# Patient Record
Sex: Male | Born: 1980 | Race: White | Hispanic: No | Marital: Married | Smoking: Never smoker
Health system: Southern US, Community
[De-identification: ages and names within clinical notes are randomized; demographics above are authoritative.]

---

## 2004-07-07 ENCOUNTER — Emergency Department: Payer: Self-pay | Admitting: Emergency Medicine

## 2007-08-17 ENCOUNTER — Emergency Department: Payer: Self-pay | Admitting: Emergency Medicine

## 2018-05-22 ENCOUNTER — Emergency Department
Admission: EM | Admit: 2018-05-22 | Discharge: 2018-05-22 | Disposition: A | Discharge status: Home / Self Care | Payer: 59 | Attending: Emergency Medicine | Admitting: Emergency Medicine

## 2018-05-22 ENCOUNTER — Other Ambulatory Visit: Payer: Self-pay

## 2018-05-22 ENCOUNTER — Encounter: Payer: Self-pay | Admitting: Emergency Medicine

## 2018-05-22 DIAGNOSIS — Y939 Activity, unspecified: Secondary | ICD-10-CM | POA: Diagnosis not present

## 2018-05-22 DIAGNOSIS — X58XXXA Exposure to other specified factors, initial encounter: Secondary | ICD-10-CM | POA: Insufficient documentation

## 2018-05-22 DIAGNOSIS — S51812A Laceration without foreign body of left forearm, initial encounter: Secondary | ICD-10-CM | POA: Diagnosis not present

## 2018-05-22 DIAGNOSIS — Z23 Encounter for immunization: Secondary | ICD-10-CM | POA: Diagnosis not present

## 2018-05-22 DIAGNOSIS — Y999 Unspecified external cause status: Secondary | ICD-10-CM | POA: Diagnosis not present

## 2018-05-22 DIAGNOSIS — Y929 Unspecified place or not applicable: Secondary | ICD-10-CM | POA: Insufficient documentation

## 2018-05-22 DIAGNOSIS — S59912A Unspecified injury of left forearm, initial encounter: Secondary | ICD-10-CM | POA: Diagnosis present

## 2018-05-22 MED ORDER — TETANUS-DIPHTH-ACELL PERTUSSIS 5-2.5-18.5 LF-MCG/0.5 IM SUSP
0.5000 mL | Freq: Once | INTRAMUSCULAR | Status: AC
  Administered 2018-05-22: 0.5 mL via INTRAMUSCULAR
  Filled 2018-05-22: qty 0.5

## 2018-05-22 NOTE — ED Provider Notes (Signed)
[facility] Emergency Department Provider Note  ____________________________________________  Time seen: Approximately 5:04 PM  I have reviewed the triage vital signs and the nursing notes.   HISTORY  Chief Complaint Laceration    HPI Tyrone Massey is a 38 y.o. male presents to the emergency department with a 1 cm left forearm laceration that appears superficial in nature.  Laceration is well approximated and scab formation has occurred.  Patient reports that he became "very scared" at home about tendon injury.  He has had some pain with movement of the thumb but has been able to move the thumb without difficulty.  No numbness or tingling along the left upper extremity. He cannot recall his last tetanus shot.         No past medical history on file.  There are no active problems to display for this patient.   History reviewed. No pertinent surgical history.  Prior to Admission medications   Not on File    Allergies Patient has no known allergies.  No family history on file.  Social History Social History   Tobacco Use  . Smoking status: Not on file  Substance Use Topics  . Alcohol use: Not on file  . Drug use: Not on file     Review of Systems  Constitutional: No fever/chills Eyes: No visual changes. No discharge ENT: No upper respiratory complaints. Cardiovascular: no chest pain. Respiratory: no cough. No SOB. Gastrointestinal: No abdominal pain.  No nausea, no vomiting.  No diarrhea.  No constipation. Genitourinary: Negative for dysuria. No hematuria Musculoskeletal: Negative for musculoskeletal pain. Skin: Patient has forearm laceration.  Neurological: Negative for headaches, focal weakness or numbness.  ____________________________________________   PHYSICAL EXAM:  VITAL SIGNS: ED Triage Vitals  Enc Vitals Group     BP 05/22/18 1605 109/74     Pulse Rate 05/22/18 1605 (!) 56     Resp 05/22/18 1605 18     Temp 05/22/18  1605 (!) 97.5 F (36.4 C)     Temp Source 05/22/18 1605 Oral     SpO2 05/22/18 1605 93 %     Weight 05/22/18 1608 205 lb (93 kg)     Height 05/22/18 1608 5\' 10"  (1.778 m)     Head Circumference --      Peak Flow --      Pain Score 05/22/18 1608 6     Pain Loc --      Pain Edu? --      Excl. in GC? --      Constitutional: Alert and oriented. Well appearing and in no acute distress. Eyes: Conjunctivae are normal. PERRL. EOMI. Head: Atraumatic. Cardiovascular: Normal rate, regular rhythm. Normal S1 and S2.  Good peripheral circulation. Respiratory: Normal respiratory effort without tachypnea or retractions. Lungs CTAB. Good air entry to the bases with no decreased or absent breath sounds. Gastrointestinal: Bowel sounds 4 quadrants. Soft and nontender to palpation. No guarding or rigidity. No palpable masses. No distention. No CVA tenderness. Musculoskeletal: Patient has 5 out of 5 strength in the upper extremities bilaterally and symmetrically.  He is able to perform resisted flexion, extension, abduction and adduction at the left thumb.  He is able to perform opposition and can perform flexion at the IP joint of the left thumb. Palpable radial pulse, left.  Neurologic:  Normal speech and language. No gross focal neurologic deficits are appreciated.  Skin:  Skin is warm, dry and intact. No rash noted. Psychiatric: Mood and affect are normal.  Speech and behavior are normal. Patient exhibits appropriate insight and judgement.   ____________________________________________   LABS (all labs ordered are listed, but only abnormal results are displayed)  Labs Reviewed - No data to display ____________________________________________  EKG   ____________________________________________  RADIOLOGY   No results found.  ____________________________________________    PROCEDURES  Procedure(s) performed:    Procedures  LACERATION REPAIR Performed by: Orvil Feil Authorized by: Orvil Feil Consent: Verbal consent obtained. Risks and benefits: risks, benefits and alternatives were discussed Consent given by: patient Patient identity confirmed: provided demographic data Prepped and Draped in normal sterile fashion Wound explored  Laceration Location: Left forearm   Laceration Length: 1 cm  No Foreign Bodies seen or palpated  Anesthesia: Topical   Local anesthetic: LET  Anesthetic total: 3 ml  Irrigation method: syringe Amount of cleaning: standard  Skin closure: Dermabond   Patient tolerance: Patient tolerated the procedure well with no immediate complications.   Medications  Tdap (BOOSTRIX) injection 0.5 mL (0.5 mLs Intramuscular Given 05/22/18 1630)     ____________________________________________   INITIAL IMPRESSION / ASSESSMENT AND PLAN / ED COURSE  Pertinent labs & imaging results that were available during my care of the patient were reviewed by me and considered in my medical decision making (see chart for details).  Review of the [place] CSRS was performed in accordance of the [facility] prior to dispensing any controlled drugs.           Assessment and plan Forearm laceration Patient presents to the emergency department with a 1 cm left forearm laceration repaired in the emergency department with Dermabond.  Patient was concerned and presented to the emergency department as he had some discomfort with movement of the left thumb.  Patient was able to exhibit full range of motion with no deficits in flexion, extension, abduction and adduction.  Patient was given a referral to Dr. Stephenie Acres if pain persists.  All patient questions were answered.    ____________________________________________  FINAL CLINICAL IMPRESSION(S) / ED DIAGNOSES  Final diagnoses:  Forearm laceration, left, initial encounter      NEW MEDICATIONS STARTED DURING THIS VISIT:  ED Discharge Orders    None          This chart was  dictated using voice recognition software/Dragon. Despite best efforts to proofread, errors can occur which can change the meaning. Any change was purely unintentional.    Orvil Feil, PA-C 05/22/18 1716    Sharman Cheek, MD 05/23/18 2238

## 2018-05-22 NOTE — ED Triage Notes (Signed)
Accidentally stuck self with pocket knife L forearm. Small lac with no bleeding at present.

## 2018-06-23 ENCOUNTER — Encounter: Payer: Self-pay | Admitting: Emergency Medicine

## 2018-06-23 ENCOUNTER — Emergency Department: Payer: 59

## 2018-06-23 ENCOUNTER — Other Ambulatory Visit: Payer: Self-pay

## 2018-06-23 ENCOUNTER — Emergency Department
Admission: EM | Admit: 2018-06-23 | Discharge: 2018-06-23 | Disposition: A | Discharge status: Home / Self Care | Payer: 59 | Attending: Emergency Medicine | Admitting: Emergency Medicine

## 2018-06-23 DIAGNOSIS — K5732 Diverticulitis of large intestine without perforation or abscess without bleeding: Secondary | ICD-10-CM | POA: Diagnosis not present

## 2018-06-23 DIAGNOSIS — R1032 Left lower quadrant pain: Secondary | ICD-10-CM | POA: Diagnosis present

## 2018-06-23 DIAGNOSIS — K5720 Diverticulitis of large intestine with perforation and abscess without bleeding: Secondary | ICD-10-CM

## 2018-06-23 LAB — COMPREHENSIVE METABOLIC PANEL
ALT: 20 U/L (ref 0–44)
AST: 16 U/L (ref 15–41)
Albumin: 4.1 g/dL (ref 3.5–5.0)
Alkaline Phosphatase: 75 U/L (ref 38–126)
Anion gap: 9 (ref 5–15)
BUN: 9 mg/dL (ref 6–20)
CO2: 23 mmol/L (ref 22–32)
Calcium: 9.1 mg/dL (ref 8.9–10.3)
Chloride: 102 mmol/L (ref 98–111)
Creatinine, Ser: 0.76 mg/dL (ref 0.61–1.24)
GFR calc Af Amer: >60 mL/min (ref >60)
GFR calc non Af Amer: >60 mL/min (ref >60)
Glucose, Bld: 93 mg/dL (ref 70–99)
Potassium: 3.8 mmol/L (ref 3.5–5.1)
Sodium: 134 mmol/L — ABNORMAL LOW (ref 135–145)
Total Bilirubin: 0.7 mg/dL (ref 0.3–1.2)
Total Protein: 7.6 g/dL (ref 6.5–8.1)

## 2018-06-23 LAB — CBC
HCT: 43.7 % (ref 39.0–52.0)
Hemoglobin: 14.6 g/dL (ref 13.0–17.0)
MCH: 30.7 pg (ref 26.0–34.0)
MCHC: 33.4 g/dL (ref 30.0–36.0)
MCV: 91.8 fL (ref 80.0–100.0)
Platelets: 209 10*3/uL (ref 150–400)
RBC: 4.76 MIL/uL (ref 4.22–5.81)
RDW: 12.1 % (ref 11.5–15.5)
WBC: 9.6 10*3/uL (ref 4.0–10.5)
nRBC: 0 % (ref 0.0–0.2)

## 2018-06-23 LAB — LIPASE, BLOOD: Lipase: 19 U/L (ref 11–51)

## 2018-06-23 MED ORDER — CIPROFLOXACIN HCL 500 MG PO TABS: 500.0000 mg | ORAL_TABLET | Freq: Two times a day (BID) | ORAL | 0 refills | Status: DC

## 2018-06-23 MED ORDER — SODIUM CHLORIDE 0.9% FLUSH: 3.0000 mL | Freq: Once | INTRAVENOUS | Status: DC

## 2018-06-23 MED ORDER — HYDROCODONE-ACETAMINOPHEN 5-325 MG PO TABS: 1.0000 | ORAL_TABLET | Freq: Four times a day (QID) | ORAL | 0 refills | Status: DC | PRN

## 2018-06-23 MED ORDER — METRONIDAZOLE 500 MG PO TABS: 500.0000 mg | ORAL_TABLET | Freq: Two times a day (BID) | ORAL | 0 refills | Status: DC

## 2018-06-23 MED ORDER — ONDANSETRON HCL 4 MG/2ML IJ SOLN
4.0000 mg | Freq: Once | INTRAMUSCULAR | Status: AC
  Administered 2018-06-23: 4 mg via INTRAVENOUS
  Filled 2018-06-23: qty 2

## 2018-06-23 MED ORDER — MORPHINE SULFATE (PF) 4 MG/ML IV SOLN
4.0000 mg | Freq: Once | INTRAVENOUS | Status: AC
  Administered 2018-06-23: 16:00:00 4 mg via INTRAVENOUS
  Filled 2018-06-23: qty 1

## 2018-06-23 NOTE — ED Notes (Signed)
Patient transported to CT. RN will give meds upon return

## 2018-06-23 NOTE — ED Triage Notes (Signed)
Pt presents to ED via POV with c/o LLQ abdominal pain since last night. Pt states "just a little bit of diarrhea but not much". Pt also endorses some urinary frequency since last night. Pt brought over from [facility] where he presented with 10/10 pain, given Toradol and pain now 5/10.

## 2018-06-23 NOTE — ED Provider Notes (Signed)
[facility] Emergency Department Provider Note   ____________________________________________    I have reviewed the triage vital signs and the nursing notes.   HISTORY  Chief Complaint Abdominal Pain     HPI Tyrone Massey is a 38 y.o. male who presents with complaints of left lower quadrant abdominal pain.  Patient reports abdominal pain started overnight and has gradually worsened.  He describes it as a moderate to severe cramping pain.  He is never had this before.  No history of abdominal surgery.  No fevers or chills.  Some loose stools.  Mild nausea no vomiting.  Seen in urgent care sent to the ED for further evaluation.  Urinalysis done that urgent care negative for hemoglobin  History reviewed. No pertinent past medical history.  There are no active problems to display for this patient.   History reviewed. No pertinent surgical history.  Prior to Admission medications   Medication Sig Start Date End Date Taking? Authorizing Provider  ciprofloxacin (CIPRO) 500 MG tablet Take 1 tablet (500 mg total) by mouth 2 (two) times daily. 06/23/18   Jene Every, MD  HYDROcodone-acetaminophen (NORCO/VICODIN) 5-325 MG tablet Take 1 tablet by mouth every 6 (six) hours as needed for severe pain. 06/23/18   Jene Every, MD  metroNIDAZOLE (FLAGYL) 500 MG tablet Take 1 tablet (500 mg total) by mouth 2 (two) times daily after a meal. 06/23/18   Jene Every, MD     Allergies Patient has no known allergies.  History reviewed. No pertinent family history.  Social History Social History   Tobacco Use  . Smoking status: Never Smoker  . Smokeless tobacco: Never Used  Substance Use Topics  . Alcohol use: Yes    Comment: Occ  . Drug use: Never    Review of Systems  Constitutional: No fever/chills Eyes: No visual changes.  ENT: No sore throat. Cardiovascular: Denies chest pain. Respiratory: Denies shortness of breath. Gastrointestinal: As above  Genitourinary: Negative for dysuria. Musculoskeletal: Negative for back pain. Skin: Negative for rash. Neurological: Negative for headaches    ____________________________________________   PHYSICAL EXAM:  VITAL SIGNS: ED Triage Vitals  Enc Vitals Group     BP 06/23/18 1536 134/79     Pulse --      Resp --      Temp --      Temp src --      SpO2 --      Weight 06/23/18 1155 93 kg (205 lb)     Height 06/23/18 1155 1.778 m (5\' 10" )     Head Circumference --      Peak Flow --      Pain Score 06/23/18 1155 5     Pain Loc --      Pain Edu? --      Excl. in GC? --     Constitutional: Alert and oriented.  Eyes: Conjunctivae are normal.   Nose: No congestion/rhinnorhea. Mouth/Throat: Mucous membranes are moist.    Cardiovascular: Normal rate, regular rhythm. Grossly normal heart sounds.  Good peripheral circulation. Respiratory: Normal respiratory effort.  No retractions. Lungs CTAB. Gastrointestinal: Tenderness palpation left lower quadrant, no distention, no CVA tenderness  Musculoskeletal: No lower extremity tenderness nor edema.  Warm and well perfused Neurologic:  Normal speech and language. No gross focal neurologic deficits are appreciated.  Skin:  Skin is warm, dry and intact. No rash noted. Psychiatric: Mood and affect are normal. Speech and behavior are normal.  ____________________________________________   LABS (  all labs ordered are listed, but only abnormal results are displayed)  Labs Reviewed  COMPREHENSIVE METABOLIC PANEL - Abnormal; Notable for the following components:      Result Value   Sodium 134 (*)    All other components within normal limits  LIPASE, BLOOD  CBC  URINALYSIS, COMPLETE (UACMP) WITH MICROSCOPIC   ____________________________________________  EKG  None ____________________________________________  RADIOLOGY  CT renal stone study demonstrates diverticulitis descending colon no perforation  ____________________________________________   PROCEDURES  Procedure(s) performed: No  Procedures   Critical Care performed: No ____________________________________________   INITIAL IMPRESSION / ASSESSMENT AND PLAN / ED COURSE  Pertinent labs & imaging results that were available during my care of the patient were reviewed by me and considered in my medical decision making (see chart for details).  Presents with left lower quadrant pain, he is tender in this area, suspicious for diverticulitis, ureterolithiasis or urinary tract infection is also certainly on the differential.  We will send for CT give IV morphine, IV Zofran and reevaluate.  Lab work is reassuring, outpatient urinalysis negative for hemoglobin or infection.  CT demonstrates diverticulitis, no perforation or abscess.   Patient's pain resolved after IV morphine, given reassuring lab work, no pain, reassuring CT will discharge on antibiotics pain medication, strict return precautions.    ____________________________________________   FINAL CLINICAL IMPRESSION(S) / ED DIAGNOSES  Final diagnoses:  Diverticulitis of large intestine with perforation without abscess or bleeding        Note:  This document was prepared using Dragon voice recognition software and may include unintentional dictation errors.   Jene EveryKinner, Ceasar Decandia, MD 06/23/18 [phone]

## 2020-04-21 IMAGING — CT CT RENAL STONE PROTOCOL
2 of 4 series · 16 of 46 positions shown, 18 images · non-contrast
Comparison: None.

CLINICAL DATA: Left lower quadrant pain for several hours

EXAM:
CT ABDOMEN AND PELVIS WITHOUT CONTRAST
TECHNIQUE: Multidetector CT imaging of the abdomen and pelvis was performed
following the standard protocol without IV contrast.

[Series 2: stone full standard · axial · 0.72mm/px · z∈[-483,-48]mm · 13 of 95 slices shown, 15 images]
[im 4/95  soft-tissue]
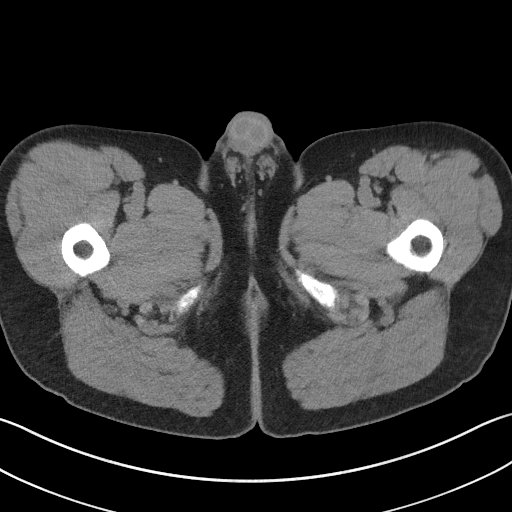
[im 4/95  bone]
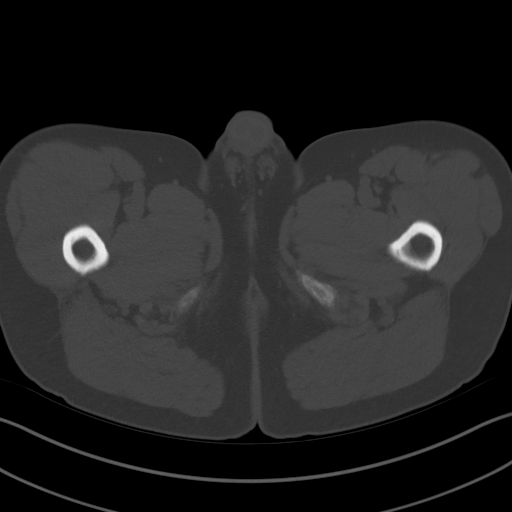
[im 12/95  soft-tissue]
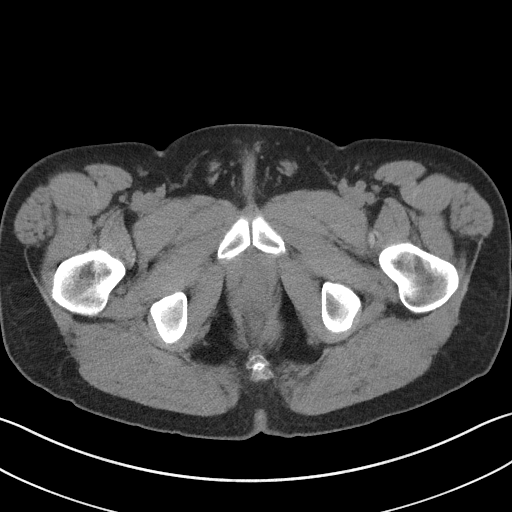
[im 20/95  soft-tissue]
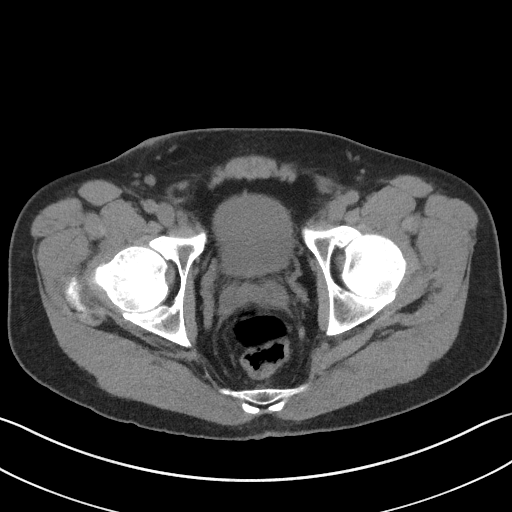
[im 28/95  soft-tissue]
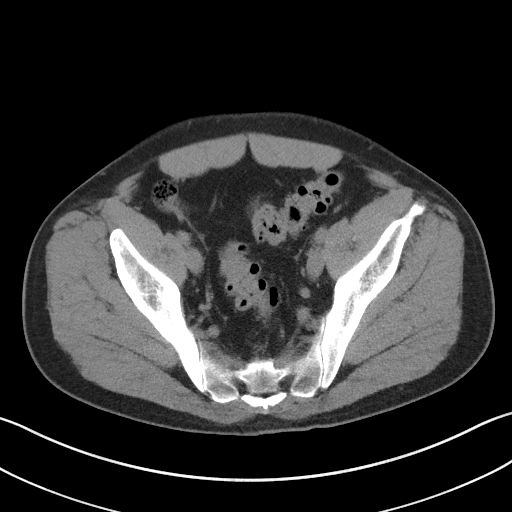
[im 32/95  soft-tissue]
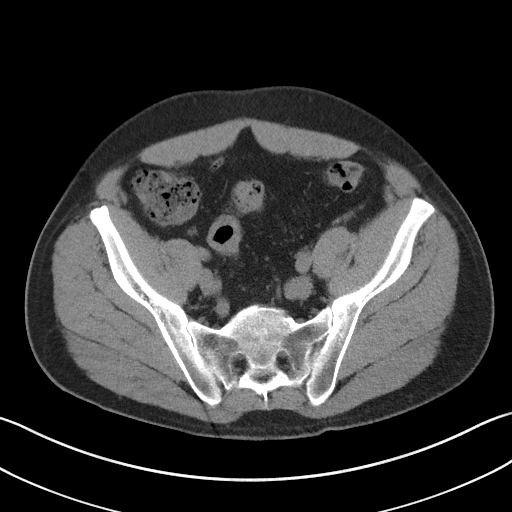
[im 40/95  soft-tissue]
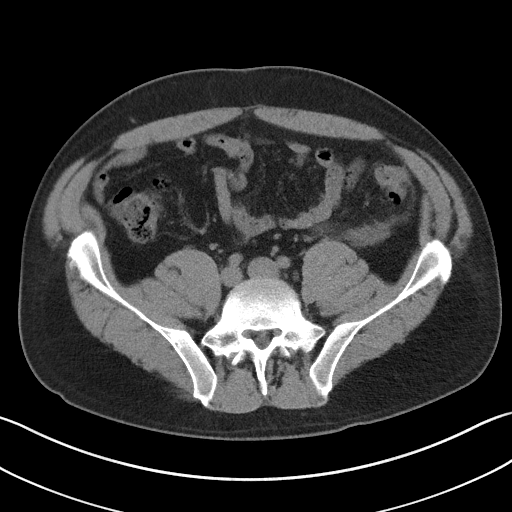
[im 48/95  soft-tissue]
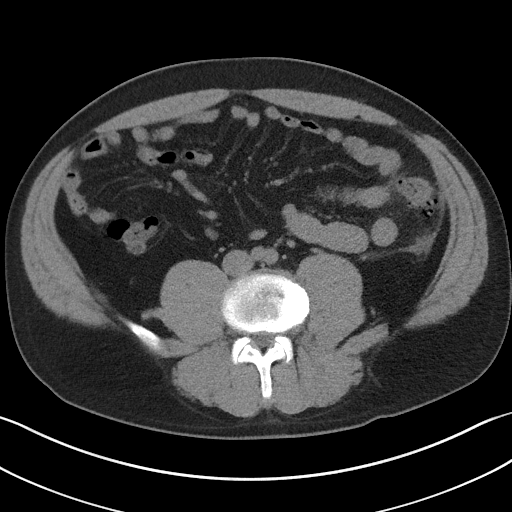
[im 55/95  soft-tissue]
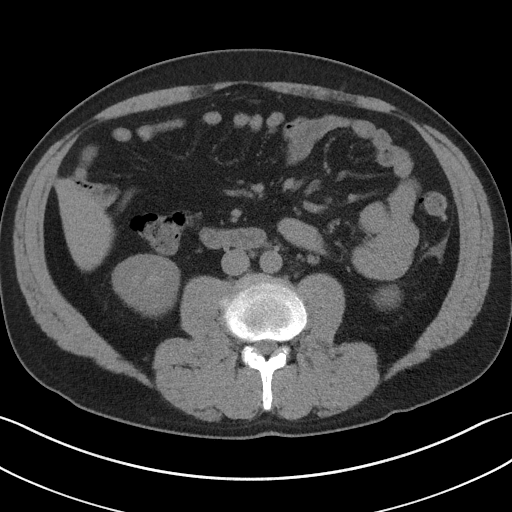
[im 63/95  soft-tissue]
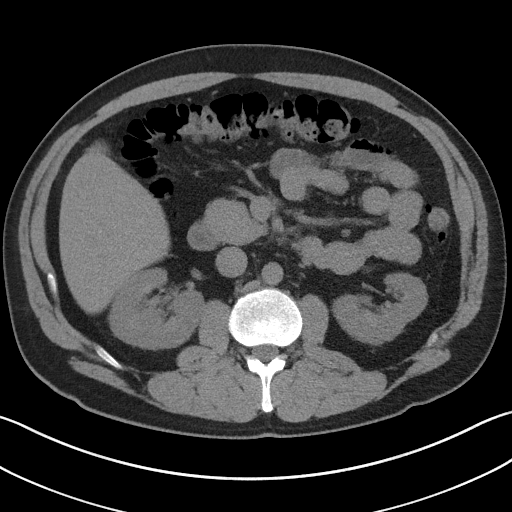
[im 63/95  bone]
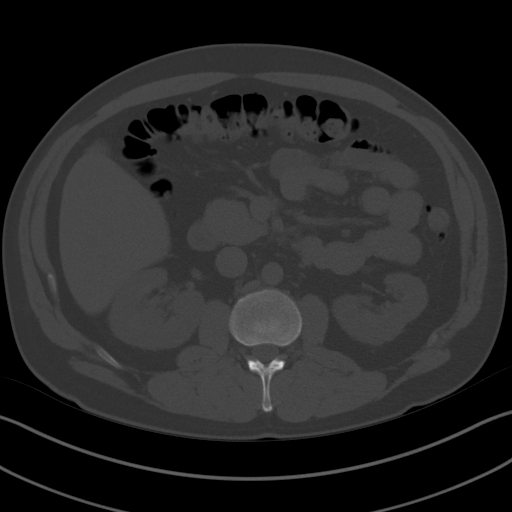
[im 67/95  soft-tissue]
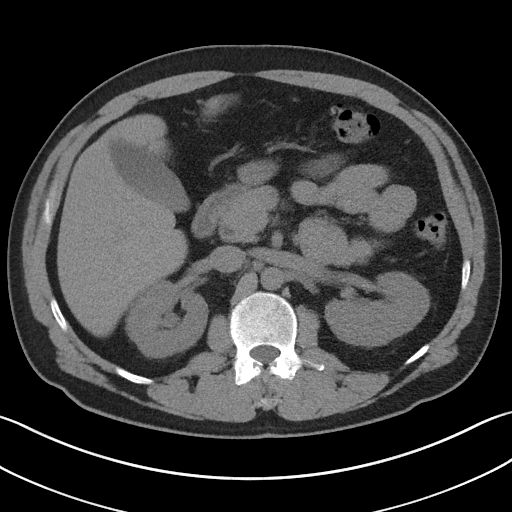
[im 75/95  soft-tissue]
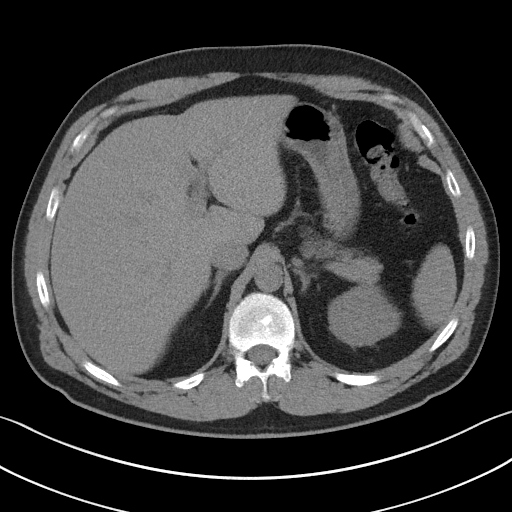
[im 83/95  soft-tissue]
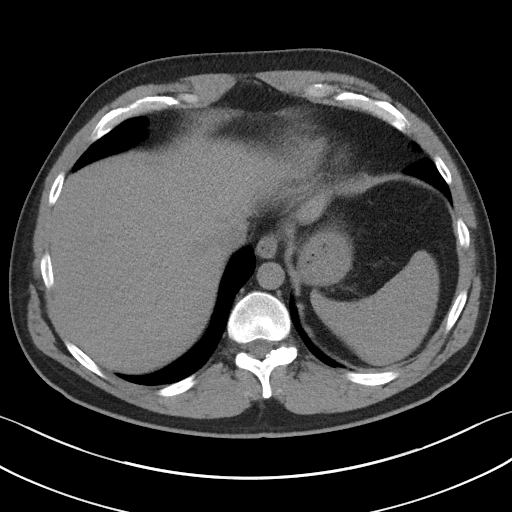
[im 91/95  soft-tissue]
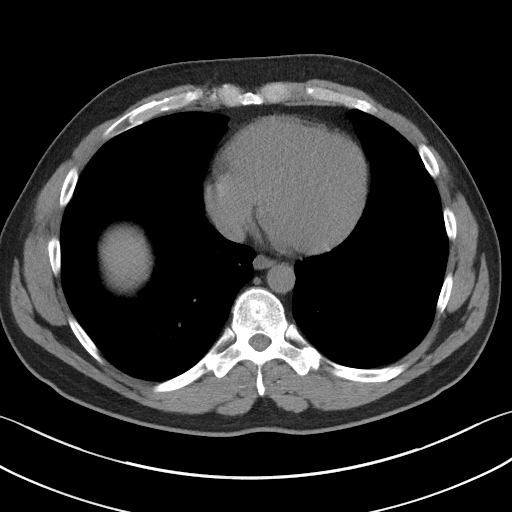

[Series 5: coronal · coronal · 0.79mm/px · 3 of 137 slices shown]
[im 46/137  soft-tissue]
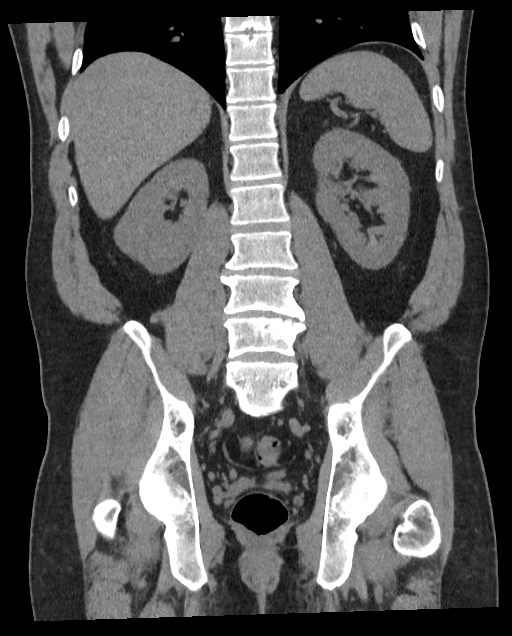
[im 61/137  soft-tissue]
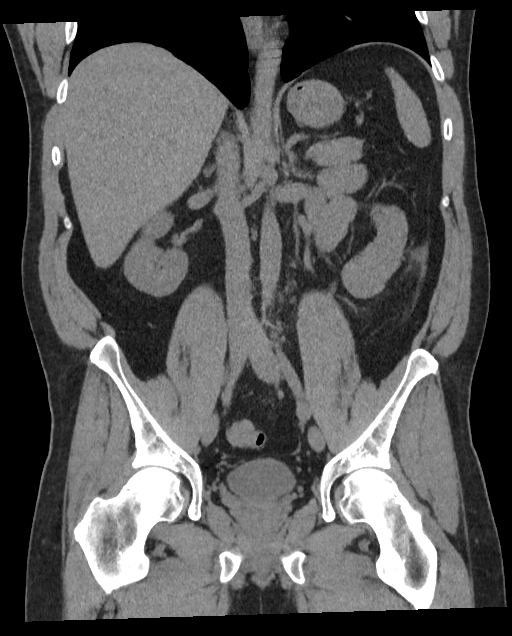
[im 76/137  soft-tissue]
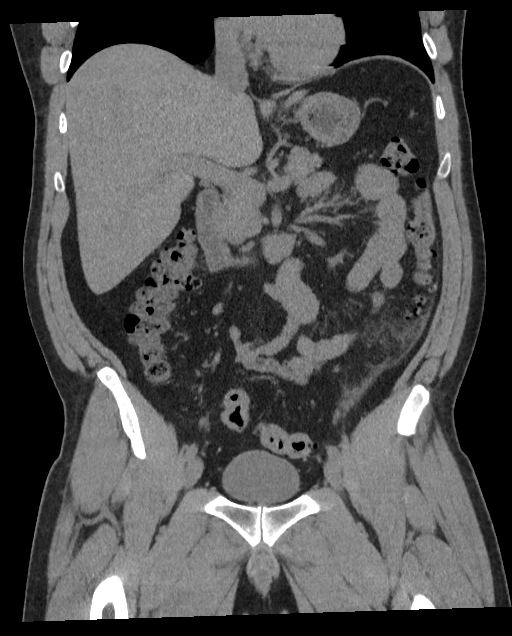

[16 of 46 positions shown; findings below may reference images not displayed]

FINDINGS: Lower chest: No acute abnormality.

Hepatobiliary: No focal liver abnormality is seen. No gallstones,
gallbladder wall thickening, or biliary dilatation.

Pancreas: Unremarkable. No pancreatic ductal dilatation or
surrounding inflammatory changes.

Spleen: Normal in size without focal abnormality.

Adrenals/Urinary Tract: Adrenal glands are within normal limits.
Kidneys demonstrate no renal calculi or urinary tract obstructive
changes. The bladder is decompressed.

Stomach/Bowel: Inflammatory changes are noted along the descending
colon without evidence of perforation or abscess formation
consistent with diverticulitis. Scattered diverticular changes noted
throughout the colon. The appendix is within normal limits. The
small bowel and stomach are unremarkable.

Vascular/Lymphatic: No significant vascular findings are present. No
enlarged abdominal or pelvic lymph nodes.

Reproductive: Prostate is unremarkable.

Other: No abdominal wall hernia or abnormality. No abdominopelvic
ascites.

Musculoskeletal: No acute or significant osseous findings.
IMPRESSION: Changes consistent with diverticulitis in the descending colon
without evidence of perforation or abscess formation.

## 2021-07-25 ENCOUNTER — Other Ambulatory Visit: Payer: Self-pay

## 2021-07-25 ENCOUNTER — Emergency Department
Admission: EM | Admit: 2021-07-25 | Discharge: 2021-07-25 | Disposition: A | Discharge status: Home / Self Care | Payer: 59 | Attending: Emergency Medicine | Admitting: Emergency Medicine

## 2021-07-25 DIAGNOSIS — S51812A Laceration without foreign body of left forearm, initial encounter: Secondary | ICD-10-CM

## 2021-07-25 DIAGNOSIS — Y990 Civilian activity done for income or pay: Secondary | ICD-10-CM | POA: Insufficient documentation

## 2021-07-25 DIAGNOSIS — X18XXXA Contact with other hot metals, initial encounter: Secondary | ICD-10-CM | POA: Insufficient documentation

## 2021-07-25 DIAGNOSIS — S59912A Unspecified injury of left forearm, initial encounter: Secondary | ICD-10-CM | POA: Diagnosis not present

## 2021-07-25 NOTE — ED Triage Notes (Signed)
Pt sustained one inch laceration to left inner forearm while installing duct work. Bleeding controlled.

## 2021-07-25 NOTE — ED Provider Notes (Signed)
[facility] Provider Note    Event Date/Time   First MD Initiated Contact with Patient 07/25/21 1247     (approximate)   History   Chief Complaint Laceration   HPI Tyrone Massey is a 41 y.o. male, no remarkable medical history, presents emergency department for evaluation of laceration to the left forearm.  He states he was installing some duct work when he accidentally cut his left forearm on a piece of metal.  Denies any foreign bodies.  No active bleeding at this time.  Denies fever/chills, numbness/tingling upper extremity, chest pain, shortness of breath, abdominal pain, or nausea/vomiting.  He is up-to-date on his tetanus.  History Limitations: No limitations.        Physical Exam  Triage Vital Signs: ED Triage Vitals  Enc Vitals Group     BP 07/25/21 1209 117/74     Pulse Rate 07/25/21 1209 (!) 55     Resp 07/25/21 1209 18     Temp 07/25/21 1212 97.9 F (36.6 C)     Temp Source 07/25/21 1209 Oral     SpO2 07/25/21 1209 97 %     Weight 07/25/21 1207 195 lb (88.5 kg)     Height 07/25/21 1207 5\' 10"  (1.778 m)     Head Circumference --      Peak Flow --      Pain Score 07/25/21 1207 0     Pain Loc --      Pain Edu? --      Excl. in GC? --     Most recent vital signs: Vitals:   07/25/21 1209 07/25/21 1212  BP: 117/74   Pulse: (!) 55   Resp: 18   Temp:  97.9 F (36.6 C)  SpO2: 97%     General: Awake, NAD.  Skin: Warm, dry. No rashes or lesions.  Eyes: PERRL. Conjunctivae normal.  CV: Good peripheral perfusion.  Resp: Normal effort.  Abd: Soft, non-tender. No distention.  Neuro: At baseline. No gross neurological deficits.   Focused Exam: 1 inch linear laceration along the left inner forearm, vertical.  No active bleeding or discharge.  No surrounding warmth or erythema.  No foreign bodies present.  Physical Exam    ED Results / Procedures / Treatments  Labs (all labs ordered are listed, but only abnormal results are  displayed) Labs Reviewed - No data to display   EKG N/A   RADIOLOGY  ED Provider Interpretation: N/A.  No results found.  PROCEDURES:  Critical Care performed: N/A.  09/24/21.Laceration Repair  Date/Time: 07/25/2021 1:26 PM  Performed by: 09/24/2021, PA Authorized by: Varney Daily, PA   Consent:    Consent obtained:  Verbal   Consent given by:  Patient   Risks discussed:  Infection and pain   Alternatives discussed:  No treatment Universal protocol:    Patient identity confirmed:  Verbally with patient Anesthesia:    Anesthesia method:  None Laceration details:    Location:  Shoulder/arm   Shoulder/arm location:  L lower arm   Length (cm):  3   Depth (mm):  2 Pre-procedure details:    Preparation:  Patient was prepped and draped in usual sterile fashion Exploration:    Hemostasis achieved with:  Direct pressure   Wound exploration: wound explored through full range of motion and entire depth of wound visualized     Wound extent: no fascia violation noted, no muscle damage noted, no underlying fracture noted and no vascular  damage noted   Treatment:    Area cleansed with:  Soap and water   Amount of cleaning:  Extensive   Irrigation solution:  Tap water   Irrigation volume:  5,000 ml   Irrigation method:  Tap Skin repair:    Repair method:  Tissue adhesive and Steri-Strips   Number of Steri-Strips:  2 Approximation:    Approximation:  Close Repair type:    Repair type:  Simple Post-procedure details:    Dressing:  Sterile dressing   Procedure completion:  Tolerated well, no immediate complications     MEDICATIONS ORDERED IN ED: Medications - No data to display   IMPRESSION / MDM / ASSESSMENT AND PLAN / ED COURSE  I reviewed the triage vital signs and the nursing notes.                              Differential diagnosis includes, but is not limited to, laceration, foreign body, cellulitis.  ED Course Patient appears well, vitals  within normal limits.  NAD.  Assessment/Plan Patient presents with left forearm laceration, 1 inch.  Cleansed thoroughly with soap and water.  No evidence of foreign bodies.  Repaired here with derma clips and Dermabond combination.  Patient tolerated the procedure well with no immediate complications.  We will plan to discharge.  Encouraged him to take Tylenol/ibuprofen as needed.  Patient's presentation is most consistent with acute, uncomplicated illness.   Provided the patient with anticipatory guidance, return precautions, and educational material. Encouraged the patient to return to the emergency department at any time if they begin to experience any new or worsening symptoms. Patient expressed understanding and agreed with the plan.       FINAL CLINICAL IMPRESSION(S) / ED DIAGNOSES   Final diagnoses:  Laceration of left forearm, initial encounter     Rx / DC Orders   ED Discharge Orders     None        Note:  This document was prepared using Dragon voice recognition software and may include unintentional dictation errors.   Varney Daily, [place] 07/25/21 1328    Concha Se, MD 07/25/21 [phone]

## 2021-07-25 NOTE — Discharge Instructions (Addendum)
-  Avoid washing the area for the first 48 to 72 hours.  After that you may take it in the shower.  Do not apply any topical ointments, lotions, or creams on the wound as this may cause the glue to dissolve.  -You may remove the derma clips in 10 days.  -Return to the emergency department anytime if you begin to experience any new or worsening symptoms.

## 2021-07-25 NOTE — ED Notes (Signed)
Wound care and reasons to return to ED discussed with pt, pt verbalized understanding. Pt ambulatory on D/C with steady gait.

## 2022-05-25 ENCOUNTER — Emergency Department: Payer: 59

## 2022-05-25 ENCOUNTER — Emergency Department
Admission: EM | Admit: 2022-05-25 | Discharge: 2022-05-25 | Disposition: A | Discharge status: Home / Self Care | Payer: 59 | Attending: Emergency Medicine | Admitting: Emergency Medicine

## 2022-05-25 DIAGNOSIS — R569 Unspecified convulsions: Secondary | ICD-10-CM | POA: Diagnosis not present

## 2022-05-25 DIAGNOSIS — Y990 Civilian activity done for income or pay: Secondary | ICD-10-CM | POA: Diagnosis not present

## 2022-05-25 DIAGNOSIS — X58XXXA Exposure to other specified factors, initial encounter: Secondary | ICD-10-CM | POA: Diagnosis not present

## 2022-05-25 DIAGNOSIS — I10 Essential (primary) hypertension: Secondary | ICD-10-CM | POA: Diagnosis not present

## 2022-05-25 DIAGNOSIS — S00512A Abrasion of oral cavity, initial encounter: Secondary | ICD-10-CM | POA: Insufficient documentation

## 2022-05-25 DIAGNOSIS — R9431 Abnormal electrocardiogram [ECG] [EKG]: Secondary | ICD-10-CM | POA: Diagnosis not present

## 2022-05-25 DIAGNOSIS — G40909 Epilepsy, unspecified, not intractable, without status epilepticus: Secondary | ICD-10-CM | POA: Diagnosis not present

## 2022-05-25 LAB — COMPREHENSIVE METABOLIC PANEL
ALT: 32 U/L (ref 0–44)
AST: 36 U/L (ref 15–41)
Albumin: 4 g/dL (ref 3.5–5.0)
Alkaline Phosphatase: 72 U/L (ref 38–126)
Anion gap: 10 (ref 5–15)
BUN: 13 mg/dL (ref 6–20)
CO2: 21 mmol/L — ABNORMAL LOW (ref 22–32)
Calcium: 9 mg/dL (ref 8.9–10.3)
Chloride: 106 mmol/L (ref 98–111)
Creatinine, Ser: 0.93 mg/dL (ref 0.61–1.24)
GFR, Estimated: >60 mL/min (ref >60)
Glucose, Bld: 103 mg/dL — ABNORMAL HIGH (ref 70–99)
Potassium: 3.8 mmol/L (ref 3.5–5.1)
Sodium: 137 mmol/L (ref 135–145)
Total Bilirubin: 0.5 mg/dL (ref 0.3–1.2)
Total Protein: 7.4 g/dL (ref 6.5–8.1)

## 2022-05-25 LAB — CBC WITH DIFFERENTIAL/PLATELET
Abs Immature Granulocytes: 0.06 10*3/uL (ref 0.00–0.07)
Basophils Absolute: 0 10*3/uL (ref 0.0–0.1)
Basophils Relative: 1 %
Eosinophils Absolute: 0.2 10*3/uL (ref 0.0–0.5)
Eosinophils Relative: 3 %
HCT: 44 % (ref 39.0–52.0)
Hemoglobin: 14.7 g/dL (ref 13.0–17.0)
Immature Granulocytes: 1 %
Lymphocytes Relative: 35 %
Lymphs Abs: 2.1 10*3/uL (ref 0.7–4.0)
MCH: 31.4 pg (ref 26.0–34.0)
MCHC: 33.4 g/dL (ref 30.0–36.0)
MCV: 94 fL (ref 80.0–100.0)
Monocytes Absolute: 0.5 10*3/uL (ref 0.1–1.0)
Monocytes Relative: 8 %
Neutro Abs: 3.2 10*3/uL (ref 1.7–7.7)
Neutrophils Relative %: 52 %
Platelets: 253 10*3/uL (ref 150–400)
RBC: 4.68 MIL/uL (ref 4.22–5.81)
RDW: 12.1 % (ref 11.5–15.5)
WBC: 6.1 10*3/uL (ref 4.0–10.5)
nRBC: 0 % (ref 0.0–0.2)

## 2022-05-25 LAB — CK: Total CK: 98 U/L (ref 49–397)

## 2022-05-25 LAB — MAGNESIUM: Magnesium: 2.3 mg/dL (ref 1.7–2.4)

## 2022-05-25 LAB — TROPONIN I (HIGH SENSITIVITY): Troponin I (High Sensitivity): 4 ng/L (ref <18)

## 2022-05-25 MED ORDER — KETOROLAC TROMETHAMINE 30 MG/ML IJ SOLN
15.0000 mg | Freq: Once | INTRAMUSCULAR | Status: AC
  Administered 2022-05-25: 15 mg via INTRAVENOUS
  Filled 2022-05-25: qty 1

## 2022-05-25 MED ORDER — DIAZEPAM 10 MG RE GEL: 10.0000 mg | Freq: Once | RECTAL | 0 refills | Status: AC

## 2022-05-25 MED ORDER — HYDROCODONE-ACETAMINOPHEN 5-325 MG PO TABS
2.0000 | ORAL_TABLET | Freq: Once | ORAL | Status: AC
  Administered 2022-05-25: 2 via ORAL
  Filled 2022-05-25: qty 2

## 2022-05-25 NOTE — Discharge Instructions (Signed)
You have been seen in the emergency department today for a likely seizure.  Your workup today including labs are within normal limits.  Please follow up with your doctor as soon as possible regarding today's emergency department visit and your likely seizure.  You will also need to follow up with a neurologist as soon as possible, please call for appointment.  If you have been prescribed a medication for your seizures, please take this medication as prescribed.  As we have discussed it is very important that you DO NOT drive until you have been seen and cleared by your neurologist.  Please drink plenty of fluids, get plenty of sleep and avoid any alcohol or drug use.  Return to the emergency department if you have any further seizures, develop any weakness/numbness of any arm/leg, confusion, slurred speech, or sudden/severe headache.  

## 2022-05-25 NOTE — ED Provider Notes (Signed)
[facility] Provider Note    Event Date/Time   First MD Initiated Contact with Patient 05/25/22 [phone]     (approximate)   History   Seizures   HPI  Tyrone Massey is a 42 y.o. male  here with first time seizure like episode. Pt was in his usual state of health last night. He denies any regular etoh use. He awoke this AM with EMS in his house. Per report, wife saw him begin having generalized seizure like activity lasting <1 min. He was then confused/post-ictal. He bit his tongue. Loss of bladder continence. He was confused with EMS but is now back to baseline. Denies any h/o seizures. No family h/o seizures. He has some occasional headaches but nothing regular. No focal numbness, weakness. He works in [employer]. Denies any recent trauma.       Physical Exam   Triage Vital Signs: ED Triage Vitals  Enc Vitals Group     BP 05/25/22 0808 (!) 145/96     Pulse Rate 05/25/22 0804 75     Resp 05/25/22 0804 16     Temp 05/25/22 0804 (!) 97.5 F (36.4 C)     Temp Source 05/25/22 0804 Oral     SpO2 05/25/22 0759 100 %     Weight 05/25/22 0807 194 lb 0.1 oz (88 kg)     Height 05/25/22 0807 5\' 10"  (1.778 m)     Head Circumference --      Peak Flow --      Pain Score 05/25/22 0807 5     Pain Loc --      Pain Edu? --      Excl. in GC? --     Most recent vital signs: Vitals:   05/25/22 0808 05/25/22 1045  BP: (!) 145/96 131/79  Pulse:  (!) 59  Resp:  (!) 6  Temp:    SpO2:  100%     General: Awake, no distress.  CV:  Good peripheral perfusion. RRR. Resp:  Normal work of breathing. Lungs clear. Abd:  No distention.  Other:  Superficial abrasion to left lateral tongue with no active bleeding. CNII-XII intact. Face symmetric. Speech normal. EOMI. Strength 5/5 bilateral UE and LE. Normal sensation to light touch. Gait is normal. No ataxia.   ED Results / Procedures / Treatments   Labs (all labs ordered are listed, but only abnormal results are  displayed) Labs Reviewed  COMPREHENSIVE METABOLIC PANEL - Abnormal; Notable for the following components:      Result Value   CO2 21 (*)    Glucose, Bld 103 (*)    All other components within normal limits  CBC WITH DIFFERENTIAL/PLATELET  MAGNESIUM  CK  URINE DRUG SCREEN, QUALITATIVE ([facility] ONLY)  ETHANOL  TROPONIN I (HIGH SENSITIVITY)     EKG Sinus rhythm, VR 73. PR 141, QRS 116, QTc 451. No acute St elevations or depressions. No ischemia or infarct.   RADIOLOGY CT Head: Negative Noncon CT   I also independently reviewed and agree with radiologist interpretations.   PROCEDURES:  Critical Care performed: No  .1-3 Lead EKG Interpretation  Performed by: Shaune Pollack, MD Authorized by: Shaune Pollack, MD     Interpretation: normal     ECG rate:  70-90   ECG rate assessment: normal     Rhythm: sinus rhythm     Ectopy: none     Conduction: normal   Comments:     Indication: Seizure/syncope   MEDICATIONS ORDERED IN  ED: Medications  HYDROcodone-acetaminophen (NORCO/VICODIN) 5-325 MG per tablet 2 tablet (2 tablets Oral Given 05/25/22 1028)  ketorolac (TORADOL) 30 MG/ML injection 15 mg (15 mg Intravenous Given 05/25/22 1029)     IMPRESSION / MDM / ASSESSMENT AND PLAN / ED COURSE  I reviewed the triage vital signs and the nursing notes.                              Differential diagnosis includes, but is not limited to, new onset seizure, convulsive syncope, arrhythmia, clonus, PNES  Patient's presentation is most consistent with acute presentation with potential threat to life or bodily function.  The patient is on the cardiac monitor to evaluate for evidence of arrhythmia and/or significant heart rate changes  Very well-appearing 42 year old male here with first-time, new onset seizure.  Patient did hit his head yesterday at work, query possible concussion related seizure.  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  No focal neurological deficits.   He is afebrile.  No evidence of meningitis or encephalitis or infectious process.  His CK is normal.  CBC, CMP is unremarkable.  He appears mildly dehydrated.  He is tolerating p.o. fluids.  No leukocytosis.  No anemia.  EKG nonischemic.  He has no ectopy or arrhythmia on telemetry evidence suggest cardiogenic or convulsive syncope.  I had a long discussion with the patient and his wife regarding new, first time seizure and need for outpatient neurology follow-up.  We also discussed the [place] rules for driving and need for clearance prior to returning.  Return precautions given.  Will give rectal Diastat as needed.    FINAL CLINICAL IMPRESSION(S) / ED DIAGNOSES   Final diagnoses:  Seizure     Rx / DC Orders   ED Discharge Orders          Ordered    diazepam (DIASTAT ACUDIAL) 10 MG GEL   Once        05/25/22 1108             Note:  This document was prepared using Dragon voice recognition software and may include unintentional dictation errors.   Shaune Pollack, MD 05/25/22 [id number]

## 2022-05-25 NOTE — ED Triage Notes (Signed)
Pt from home via ems- wife called 911 for seizure like activity that lasted about 15 seconds. Pt was incontinent of urine and postictal when ems arrived. Pt arrived to ED a/o x 4. C/o back pain.

## 2022-05-27 DIAGNOSIS — R5382 Chronic fatigue, unspecified: Secondary | ICD-10-CM | POA: Diagnosis not present

## 2022-05-27 DIAGNOSIS — R519 Headache, unspecified: Secondary | ICD-10-CM | POA: Diagnosis not present

## 2022-05-27 DIAGNOSIS — R400 Somnolence: Secondary | ICD-10-CM | POA: Diagnosis not present

## 2022-05-27 DIAGNOSIS — R569 Unspecified convulsions: Secondary | ICD-10-CM | POA: Diagnosis not present

## 2022-05-27 DIAGNOSIS — G479 Sleep disorder, unspecified: Secondary | ICD-10-CM | POA: Diagnosis not present

## 2022-05-27 DIAGNOSIS — R4189 Other symptoms and signs involving cognitive functions and awareness: Secondary | ICD-10-CM | POA: Diagnosis not present

## 2022-05-28 ENCOUNTER — Other Ambulatory Visit: Payer: Self-pay | Admitting: Neurology

## 2022-05-28 DIAGNOSIS — R569 Unspecified convulsions: Secondary | ICD-10-CM

## 2022-05-30 ENCOUNTER — Encounter: Payer: Self-pay | Admitting: Neurology

## 2022-06-02 ENCOUNTER — Ambulatory Visit
Admission: RE | Admit: 2022-06-02 | Discharge: 2022-06-02 | Disposition: A | Discharge status: Home / Self Care | Payer: 59 | Source: Ambulatory Visit | Attending: Neurology | Admitting: Neurology

## 2022-06-02 DIAGNOSIS — R569 Unspecified convulsions: Secondary | ICD-10-CM

## 2022-08-02 DIAGNOSIS — G4733 Obstructive sleep apnea (adult) (pediatric): Secondary | ICD-10-CM | POA: Diagnosis not present

## 2022-10-07 DIAGNOSIS — R519 Headache, unspecified: Secondary | ICD-10-CM | POA: Diagnosis not present

## 2022-10-07 DIAGNOSIS — G4719 Other hypersomnia: Secondary | ICD-10-CM | POA: Diagnosis not present

## 2022-10-07 DIAGNOSIS — R0681 Apnea, not elsewhere classified: Secondary | ICD-10-CM | POA: Diagnosis not present

## 2022-10-07 DIAGNOSIS — R569 Unspecified convulsions: Secondary | ICD-10-CM | POA: Diagnosis not present

## 2022-10-07 DIAGNOSIS — G4733 Obstructive sleep apnea (adult) (pediatric): Secondary | ICD-10-CM | POA: Diagnosis not present

## 2022-10-07 DIAGNOSIS — G479 Sleep disorder, unspecified: Secondary | ICD-10-CM | POA: Diagnosis not present

## 2022-10-14 DIAGNOSIS — G4733 Obstructive sleep apnea (adult) (pediatric): Secondary | ICD-10-CM | POA: Diagnosis not present

## 2022-11-14 DIAGNOSIS — G4733 Obstructive sleep apnea (adult) (pediatric): Secondary | ICD-10-CM | POA: Diagnosis not present

## 2022-12-08 DIAGNOSIS — R400 Somnolence: Secondary | ICD-10-CM | POA: Diagnosis not present

## 2022-12-08 DIAGNOSIS — G4733 Obstructive sleep apnea (adult) (pediatric): Secondary | ICD-10-CM | POA: Diagnosis not present

## 2022-12-08 DIAGNOSIS — R569 Unspecified convulsions: Secondary | ICD-10-CM | POA: Diagnosis not present

## 2022-12-08 DIAGNOSIS — R519 Headache, unspecified: Secondary | ICD-10-CM | POA: Diagnosis not present

## 2022-12-08 DIAGNOSIS — R4189 Other symptoms and signs involving cognitive functions and awareness: Secondary | ICD-10-CM | POA: Diagnosis not present

## 2023-02-13 DIAGNOSIS — G4733 Obstructive sleep apnea (adult) (pediatric): Secondary | ICD-10-CM | POA: Diagnosis not present

## 2023-02-16 DIAGNOSIS — M79652 Pain in left thigh: Secondary | ICD-10-CM | POA: Diagnosis not present

## 2023-02-16 DIAGNOSIS — M25552 Pain in left hip: Secondary | ICD-10-CM | POA: Diagnosis not present

## 2023-02-16 DIAGNOSIS — S42021A Displaced fracture of shaft of right clavicle, initial encounter for closed fracture: Secondary | ICD-10-CM | POA: Diagnosis not present

## 2023-02-17 DIAGNOSIS — S51812A Laceration without foreign body of left forearm, initial encounter: Secondary | ICD-10-CM | POA: Diagnosis not present

## 2023-02-17 DIAGNOSIS — S42002A Fracture of unspecified part of left clavicle, initial encounter for closed fracture: Secondary | ICD-10-CM | POA: Diagnosis not present

## 2023-02-17 DIAGNOSIS — S7012XA Contusion of left thigh, initial encounter: Secondary | ICD-10-CM | POA: Diagnosis not present

## 2023-03-16 DIAGNOSIS — G4733 Obstructive sleep apnea (adult) (pediatric): Secondary | ICD-10-CM | POA: Diagnosis not present

## 2023-03-26 DIAGNOSIS — S42001A Fracture of unspecified part of right clavicle, initial encounter for closed fracture: Secondary | ICD-10-CM | POA: Diagnosis not present

## 2023-03-29 DIAGNOSIS — G4733 Obstructive sleep apnea (adult) (pediatric): Secondary | ICD-10-CM | POA: Diagnosis not present

## 2023-04-16 DIAGNOSIS — G4733 Obstructive sleep apnea (adult) (pediatric): Secondary | ICD-10-CM | POA: Diagnosis not present

## 2023-05-05 DIAGNOSIS — S42001D Fracture of unspecified part of right clavicle, subsequent encounter for fracture with routine healing: Secondary | ICD-10-CM | POA: Diagnosis not present

## 2023-05-14 DIAGNOSIS — G4733 Obstructive sleep apnea (adult) (pediatric): Secondary | ICD-10-CM | POA: Diagnosis not present

## 2023-06-14 DIAGNOSIS — G4733 Obstructive sleep apnea (adult) (pediatric): Secondary | ICD-10-CM | POA: Diagnosis not present

## 2023-06-29 DIAGNOSIS — G4733 Obstructive sleep apnea (adult) (pediatric): Secondary | ICD-10-CM | POA: Diagnosis not present

## 2023-07-14 DIAGNOSIS — G4733 Obstructive sleep apnea (adult) (pediatric): Secondary | ICD-10-CM | POA: Diagnosis not present

## 2023-08-05 DIAGNOSIS — R569 Unspecified convulsions: Secondary | ICD-10-CM | POA: Diagnosis not present

## 2023-08-07 DIAGNOSIS — R569 Unspecified convulsions: Secondary | ICD-10-CM | POA: Diagnosis not present

## 2023-08-08 DIAGNOSIS — R569 Unspecified convulsions: Secondary | ICD-10-CM | POA: Diagnosis not present

## 2023-08-09 DIAGNOSIS — R569 Unspecified convulsions: Secondary | ICD-10-CM | POA: Diagnosis not present

## 2023-10-16 DIAGNOSIS — G4733 Obstructive sleep apnea (adult) (pediatric): Secondary | ICD-10-CM | POA: Diagnosis not present

## 2023-10-22 DIAGNOSIS — R569 Unspecified convulsions: Secondary | ICD-10-CM | POA: Diagnosis not present

## 2023-10-22 DIAGNOSIS — Z79899 Other long term (current) drug therapy: Secondary | ICD-10-CM | POA: Diagnosis not present

## 2023-12-16 DIAGNOSIS — R400 Somnolence: Secondary | ICD-10-CM | POA: Diagnosis not present

## 2023-12-16 DIAGNOSIS — R569 Unspecified convulsions: Secondary | ICD-10-CM | POA: Diagnosis not present

## 2023-12-16 DIAGNOSIS — R519 Headache, unspecified: Secondary | ICD-10-CM | POA: Diagnosis not present

## 2023-12-16 DIAGNOSIS — G479 Sleep disorder, unspecified: Secondary | ICD-10-CM | POA: Diagnosis not present
